# Patient Record
Sex: Female | Born: 2000 | Race: Black or African American | Hispanic: No | Marital: Single | Smoking: Never smoker
Health system: Southern US, Community
[De-identification: ages and names within clinical notes are randomized; demographics above are authoritative.]

---
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
## 2022-06-02 DIAGNOSIS — R7303 Prediabetes: Secondary | ICD-10-CM | POA: Diagnosis not present

## 2022-06-02 DIAGNOSIS — Z0001 Encounter for general adult medical examination with abnormal findings: Secondary | ICD-10-CM | POA: Diagnosis not present

## 2022-06-02 DIAGNOSIS — R21 Rash and other nonspecific skin eruption: Secondary | ICD-10-CM | POA: Diagnosis not present

## 2022-07-22 DIAGNOSIS — F321 Major depressive disorder, single episode, moderate: Secondary | ICD-10-CM | POA: Diagnosis not present

## 2022-07-22 DIAGNOSIS — F411 Generalized anxiety disorder: Secondary | ICD-10-CM | POA: Diagnosis not present

## 2022-08-20 DIAGNOSIS — F321 Major depressive disorder, single episode, moderate: Secondary | ICD-10-CM | POA: Diagnosis not present

## 2022-08-20 DIAGNOSIS — F411 Generalized anxiety disorder: Secondary | ICD-10-CM | POA: Diagnosis not present

## 2022-08-27 DIAGNOSIS — F321 Major depressive disorder, single episode, moderate: Secondary | ICD-10-CM | POA: Diagnosis not present

## 2022-08-27 DIAGNOSIS — F411 Generalized anxiety disorder: Secondary | ICD-10-CM | POA: Diagnosis not present

## 2022-09-03 DIAGNOSIS — F411 Generalized anxiety disorder: Secondary | ICD-10-CM | POA: Diagnosis not present

## 2022-09-03 DIAGNOSIS — F321 Major depressive disorder, single episode, moderate: Secondary | ICD-10-CM | POA: Diagnosis not present

## 2022-09-10 DIAGNOSIS — F411 Generalized anxiety disorder: Secondary | ICD-10-CM | POA: Diagnosis not present

## 2022-09-10 DIAGNOSIS — F321 Major depressive disorder, single episode, moderate: Secondary | ICD-10-CM | POA: Diagnosis not present

## 2022-09-17 DIAGNOSIS — F321 Major depressive disorder, single episode, moderate: Secondary | ICD-10-CM | POA: Diagnosis not present

## 2022-09-17 DIAGNOSIS — F411 Generalized anxiety disorder: Secondary | ICD-10-CM | POA: Diagnosis not present

## 2022-09-22 DIAGNOSIS — Z130 Encounter for screening for diseases of the blood and blood-forming organs and certain disorders involving the immune mechanism: Secondary | ICD-10-CM | POA: Diagnosis not present

## 2022-09-22 DIAGNOSIS — E560 Deficiency of vitamin E: Secondary | ICD-10-CM | POA: Diagnosis not present

## 2022-09-22 DIAGNOSIS — Z113 Encounter for screening for infections with a predominantly sexual mode of transmission: Secondary | ICD-10-CM | POA: Diagnosis not present

## 2022-09-22 DIAGNOSIS — Z114 Encounter for screening for human immunodeficiency virus [HIV]: Secondary | ICD-10-CM | POA: Diagnosis not present

## 2022-09-22 DIAGNOSIS — Z131 Encounter for screening for diabetes mellitus: Secondary | ICD-10-CM | POA: Diagnosis not present

## 2022-09-24 DIAGNOSIS — F321 Major depressive disorder, single episode, moderate: Secondary | ICD-10-CM | POA: Diagnosis not present

## 2022-09-24 DIAGNOSIS — F411 Generalized anxiety disorder: Secondary | ICD-10-CM | POA: Diagnosis not present

## 2022-10-01 DIAGNOSIS — F411 Generalized anxiety disorder: Secondary | ICD-10-CM | POA: Diagnosis not present

## 2022-10-01 DIAGNOSIS — F321 Major depressive disorder, single episode, moderate: Secondary | ICD-10-CM | POA: Diagnosis not present

## 2022-10-08 DIAGNOSIS — F321 Major depressive disorder, single episode, moderate: Secondary | ICD-10-CM | POA: Diagnosis not present

## 2022-10-08 DIAGNOSIS — F411 Generalized anxiety disorder: Secondary | ICD-10-CM | POA: Diagnosis not present

## 2022-11-12 DIAGNOSIS — F321 Major depressive disorder, single episode, moderate: Secondary | ICD-10-CM | POA: Diagnosis not present

## 2022-11-12 DIAGNOSIS — F411 Generalized anxiety disorder: Secondary | ICD-10-CM | POA: Diagnosis not present

## 2022-11-19 DIAGNOSIS — Z114 Encounter for screening for human immunodeficiency virus [HIV]: Secondary | ICD-10-CM | POA: Diagnosis not present

## 2022-11-19 DIAGNOSIS — F411 Generalized anxiety disorder: Secondary | ICD-10-CM | POA: Diagnosis not present

## 2022-11-19 DIAGNOSIS — F321 Major depressive disorder, single episode, moderate: Secondary | ICD-10-CM | POA: Diagnosis not present

## 2022-11-19 DIAGNOSIS — Z131 Encounter for screening for diabetes mellitus: Secondary | ICD-10-CM | POA: Diagnosis not present

## 2022-11-19 DIAGNOSIS — Z113 Encounter for screening for infections with a predominantly sexual mode of transmission: Secondary | ICD-10-CM | POA: Diagnosis not present

## 2022-11-26 DIAGNOSIS — F321 Major depressive disorder, single episode, moderate: Secondary | ICD-10-CM | POA: Diagnosis not present

## 2022-11-26 DIAGNOSIS — F411 Generalized anxiety disorder: Secondary | ICD-10-CM | POA: Diagnosis not present

## 2022-12-17 DIAGNOSIS — F411 Generalized anxiety disorder: Secondary | ICD-10-CM | POA: Diagnosis not present

## 2022-12-17 DIAGNOSIS — F321 Major depressive disorder, single episode, moderate: Secondary | ICD-10-CM | POA: Diagnosis not present

## 2023-05-09 DIAGNOSIS — F411 Generalized anxiety disorder: Secondary | ICD-10-CM | POA: Diagnosis not present

## 2023-05-09 DIAGNOSIS — F321 Major depressive disorder, single episode, moderate: Secondary | ICD-10-CM | POA: Diagnosis not present

## 2023-05-19 DIAGNOSIS — F411 Generalized anxiety disorder: Secondary | ICD-10-CM | POA: Diagnosis not present

## 2023-05-19 DIAGNOSIS — F321 Major depressive disorder, single episode, moderate: Secondary | ICD-10-CM | POA: Diagnosis not present

## 2023-11-13 IMAGING — DX DG ANKLE COMPLETE 3+V*L*
3 series · 3 of 3 positions shown · non-contrast
Comparison: No priors.

CLINICAL DATA: 20-year-old female with history of left ankle pain.

EXAM:
LEFT ANKLE COMPLETE - 3+ VIEW

[ankle ap]
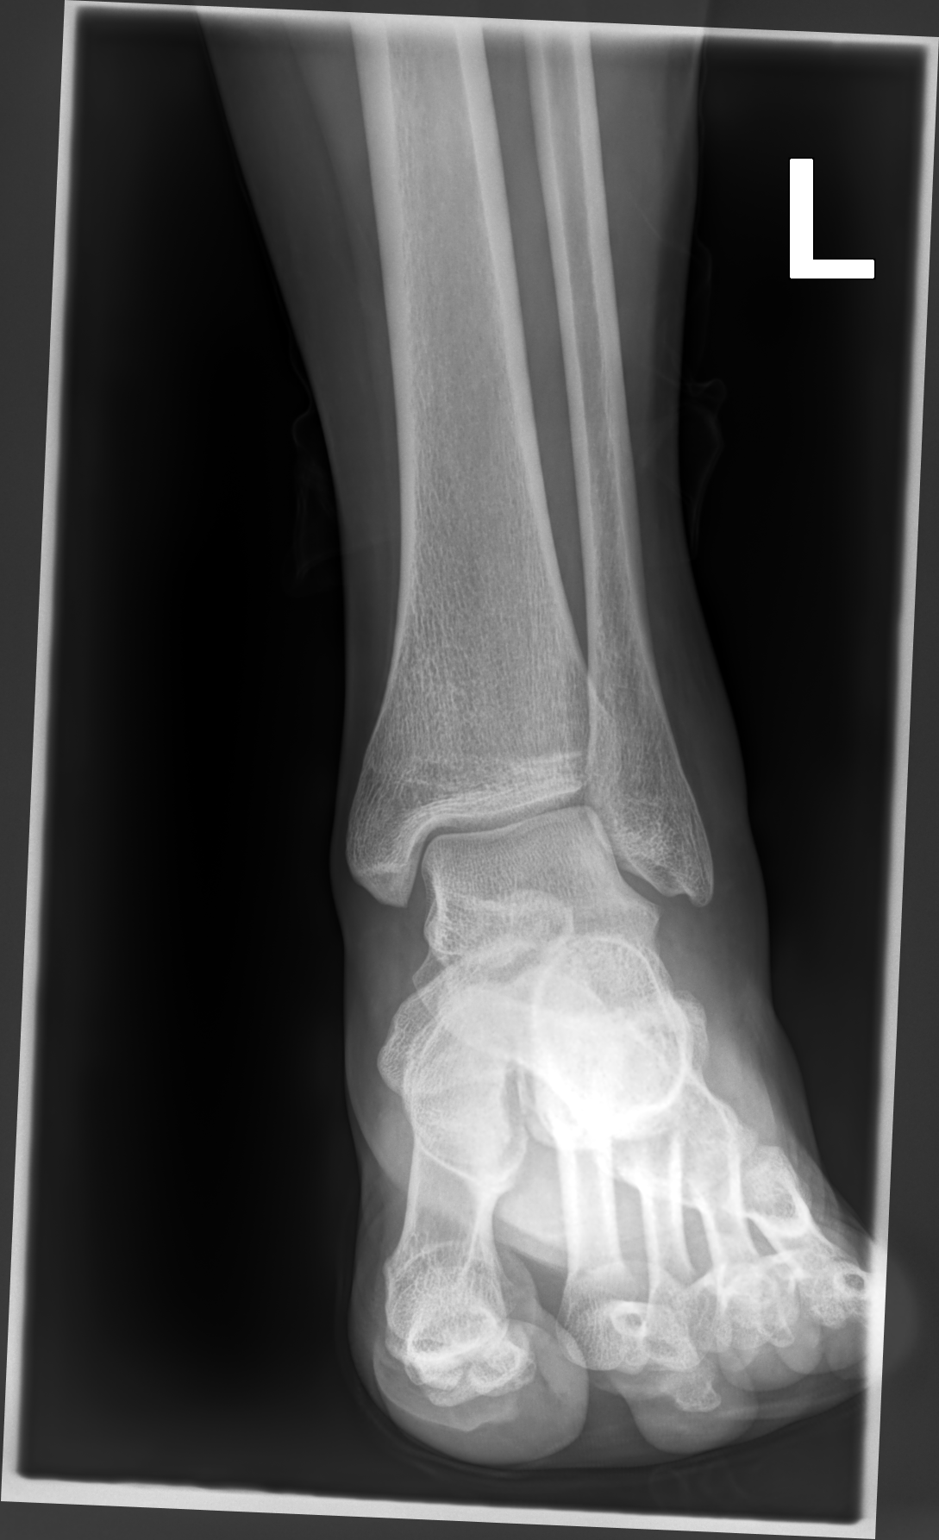

[ankle medial oblique]
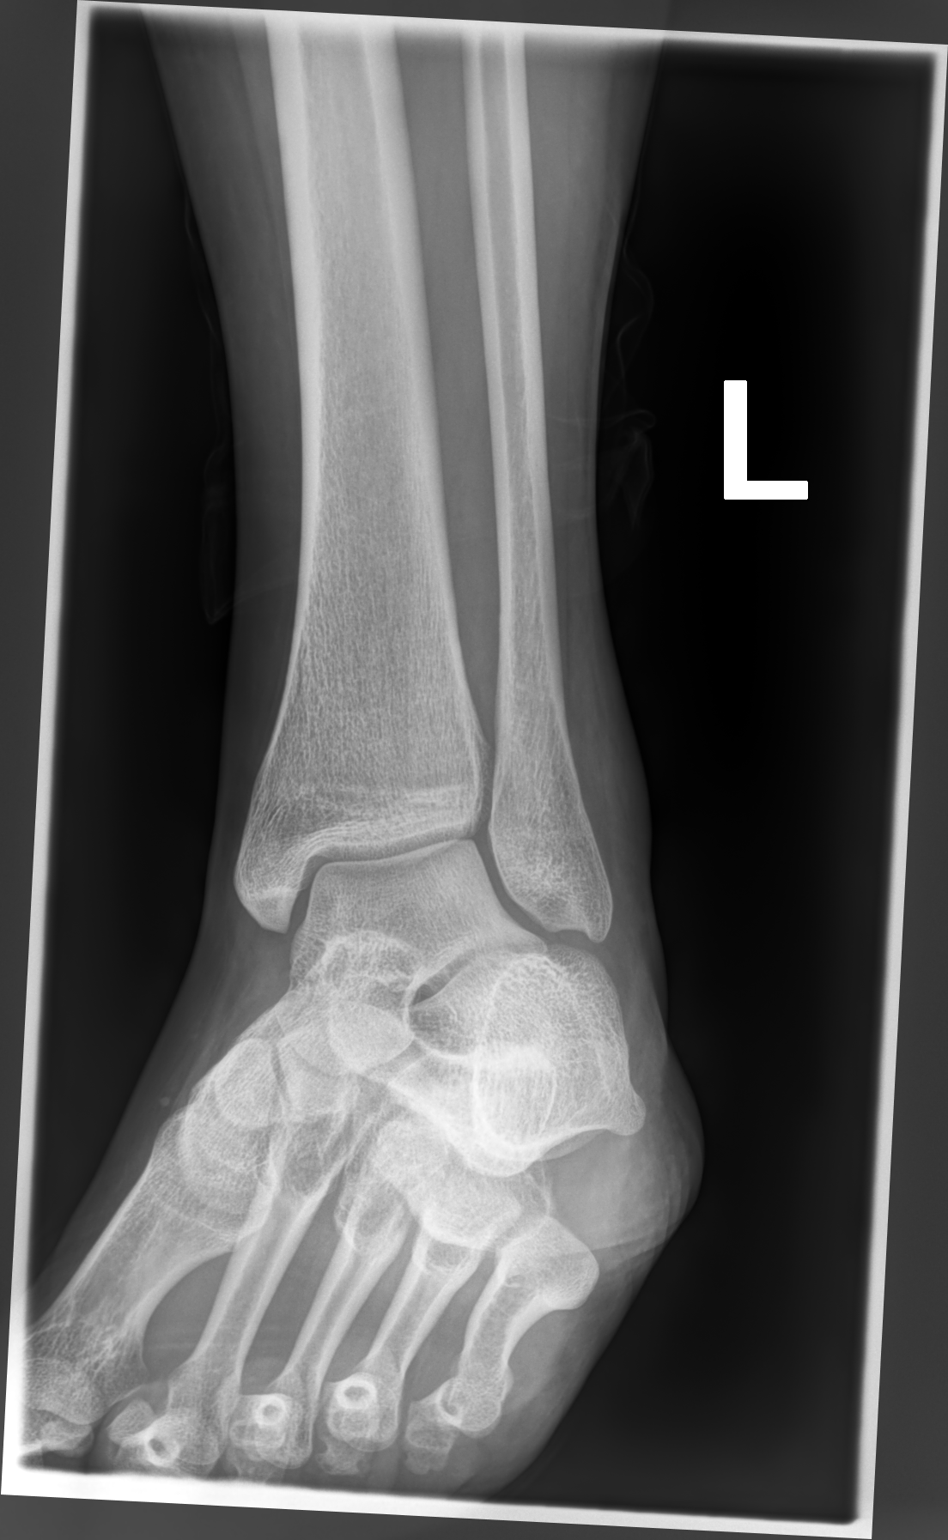

[ankle lat]
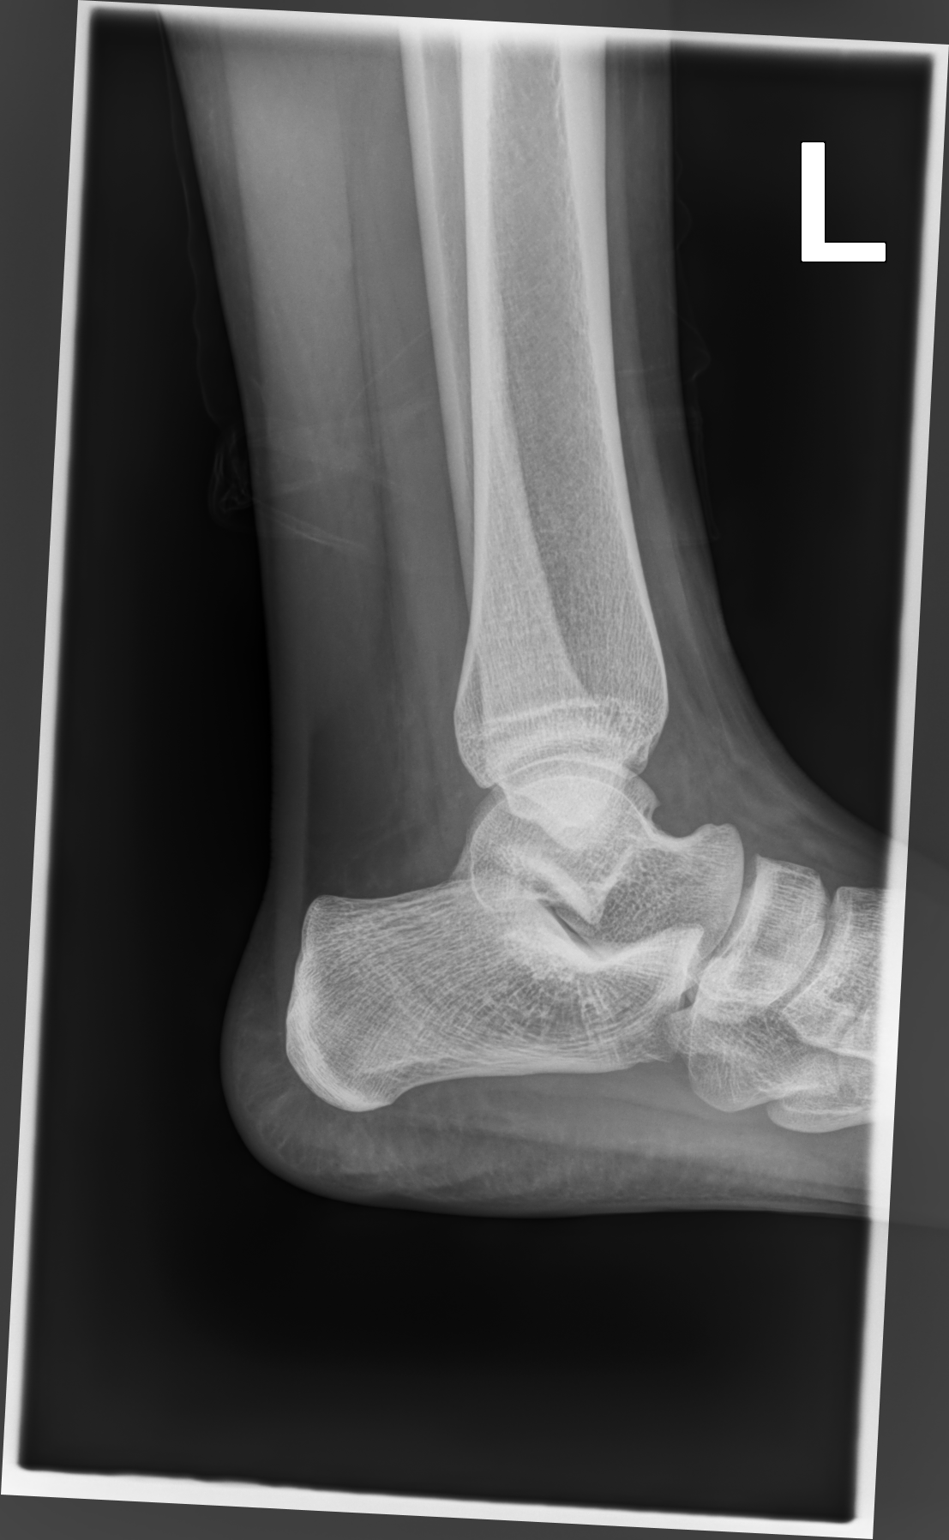

[3 of 3 positions shown; findings below may reference images not displayed]

FINDINGS: Soft tissue swelling overlying the lateral malleolus. No acute
displaced fracture, subluxation or dislocation.
IMPRESSION: 1. Soft tissue swelling overlying the lateral malleolus without
evidence of underlying acute bony trauma.
